# Patient Record
Sex: Male | Born: 1990 | Race: White | Hispanic: Yes | Marital: Single | Smoking: Current every day smoker
Health system: Southern US, Community
[De-identification: ages and names within clinical notes are randomized; demographics above are authoritative.]

---

## 2010-11-29 ENCOUNTER — Emergency Department (HOSPITAL_COMMUNITY)
Admission: EM | Admit: 2010-11-29 | Discharge: 2010-11-29 | Disposition: A | Discharge status: Home / Self Care | Payer: Self-pay | Attending: Emergency Medicine | Admitting: Emergency Medicine

## 2010-11-29 DIAGNOSIS — S5000XA Contusion of unspecified elbow, initial encounter: Secondary | ICD-10-CM | POA: Insufficient documentation

## 2010-11-29 DIAGNOSIS — R51 Headache: Secondary | ICD-10-CM | POA: Insufficient documentation

## 2010-11-29 DIAGNOSIS — IMO0002 Reserved for concepts with insufficient information to code with codable children: Secondary | ICD-10-CM | POA: Insufficient documentation

## 2010-12-03 ENCOUNTER — Emergency Department (HOSPITAL_COMMUNITY)
Admission: EM | Admit: 2010-12-03 | Discharge: 2010-12-03 | Disposition: A | Discharge status: Home / Self Care | Payer: Self-pay | Attending: Emergency Medicine | Admitting: Emergency Medicine

## 2010-12-03 ENCOUNTER — Emergency Department (HOSPITAL_COMMUNITY): Payer: Self-pay

## 2010-12-03 DIAGNOSIS — S61209A Unspecified open wound of unspecified finger without damage to nail, initial encounter: Secondary | ICD-10-CM | POA: Insufficient documentation

## 2010-12-03 DIAGNOSIS — S51809A Unspecified open wound of unspecified forearm, initial encounter: Secondary | ICD-10-CM | POA: Insufficient documentation

## 2018-11-15 ENCOUNTER — Encounter (HOSPITAL_COMMUNITY): Payer: Self-pay | Admitting: Emergency Medicine

## 2018-11-15 ENCOUNTER — Other Ambulatory Visit: Payer: Self-pay

## 2018-11-15 ENCOUNTER — Emergency Department (HOSPITAL_COMMUNITY)
Admission: EM | Admit: 2018-11-15 | Discharge: 2018-11-16 | Disposition: A | Discharge status: Home / Self Care | Payer: Self-pay | Attending: Emergency Medicine | Admitting: Emergency Medicine

## 2018-11-15 DIAGNOSIS — L0231 Cutaneous abscess of buttock: Secondary | ICD-10-CM | POA: Insufficient documentation

## 2018-11-15 DIAGNOSIS — F1290 Cannabis use, unspecified, uncomplicated: Secondary | ICD-10-CM | POA: Insufficient documentation

## 2018-11-15 DIAGNOSIS — F17210 Nicotine dependence, cigarettes, uncomplicated: Secondary | ICD-10-CM | POA: Insufficient documentation

## 2018-11-15 NOTE — ED Triage Notes (Signed)
abcess to left buttock.  Patient reports it came up on Monday.  Worse since then.

## 2018-11-16 MED ORDER — LIDOCAINE-EPINEPHRINE (PF) 2 %-1:200000 IJ SOLN
10.0000 mL | Freq: Once | INTRAMUSCULAR | Status: AC
  Administered 2018-11-16: 10 mL
  Filled 2018-11-16: qty 20

## 2018-11-16 MED ORDER — SULFAMETHOXAZOLE-TRIMETHOPRIM 800-160 MG PO TABS: 1.0000 | ORAL_TABLET | Freq: Two times a day (BID) | ORAL | 0 refills | Status: AC

## 2018-11-16 NOTE — ED Provider Notes (Signed)
[facility] Provider Note   CSN: [id number] Arrival date & time: 11/15/18  2334    History   Chief Complaint Chief Complaint  Patient presents with  . Abscess    HPI Micheal Reyes is a 28 y.o. male.     28 year old male presents to the emergency department for evaluation of an abscess x5 days.  Abscess has been constant, worsening. Tried applying warm compresses without relief. Has been smoking marijuana to cope with his pain. No pain with defecation, fevers, drainage from the area, hx of prior abscess.     History reviewed. No pertinent past medical history.  There are no active problems to display for this patient.   History reviewed. No pertinent surgical history.      Home Medications    Prior to Admission medications   Medication Sig Start Date End Date Taking? Authorizing Provider  sulfamethoxazole-trimethoprim (BACTRIM DS) 800-160 MG tablet Take 1 tablet by mouth 2 (two) times daily for 5 days. 11/16/18 11/21/18  Antony MaduraHumes, Azani Brogdon, PA-C    Family History No family history on file.  Social History Social History   Tobacco Use  . Smoking status: Current Every Day Smoker  . Smokeless tobacco: Never Used  Substance Use Topics  . Alcohol use: Yes    Comment: occasionally  . Drug use: Yes    Types: Marijuana     Allergies   Patient has no allergy information on record.   Review of Systems Review of Systems Ten systems reviewed and are negative for acute change, except as noted in the HPI.    Physical Exam Updated Vital Signs BP 126/85   Pulse 82   Temp 98 F (36.7 C) (Oral)   Resp 16   Ht 5\' 5"  (1.651 m)   Wt 85.7 kg   SpO2 98%   BMI 31.45 kg/m   Physical Exam Vitals signs and nursing note reviewed.  Constitutional:      General: He is not in acute distress.    Appearance: He is well-developed. He is not diaphoretic.     Comments: Nontoxic appearing and in NAD  HENT:     Head: Normocephalic and  atraumatic.  Eyes:     General: No scleral icterus.    Conjunctiva/sclera: Conjunctivae normal.  Neck:     Musculoskeletal: Normal range of motion.  Cardiovascular:     Rate and Rhythm: Normal rate and regular rhythm.     Pulses: Normal pulses.  Pulmonary:     Effort: Pulmonary effort is normal. No respiratory distress.     Comments: Respirations even and unlabored Genitourinary:   Musculoskeletal: Normal range of motion.  Skin:    General: Skin is warm and dry.     Coloration: Skin is not pale.     Findings: No erythema or rash.  Neurological:     General: No focal deficit present.     Mental Status: He is alert and oriented to person, place, and time.     Coordination: Coordination normal.  Psychiatric:        Behavior: Behavior normal.      ED Treatments / Results  Labs (all labs ordered are listed, but only abnormal results are displayed) Labs Reviewed - No data to display  EKG None  Radiology No results found.  Procedures Procedures (including critical care time)  INCISION AND DRAINAGE Performed by: Antony MaduraKelly Frayda Egley Consent: Verbal consent obtained. Risks and benefits: risks, benefits and alternatives were discussed Type: abscess  Body area:  L gluteal  Anesthesia: local infiltration  Incision was made with a scalpel.  Local anesthetic: lidocaine 1% with epinephrine  Anesthetic total: 6 ml  Complexity: complex Blunt dissection to break up loculations  Drainage: purulent and bloody  Drainage amount: moderate  Packing material: none  Patient tolerance: Patient tolerated the procedure well with no immediate complications.     Medications Ordered in ED Medications  lidocaine-EPINEPHrine (XYLOCAINE W/EPI) 2 %-1:200000 (PF) injection 10 mL (10 mLs Other Given 11/16/18 0033)     Initial Impression / Assessment and Plan / ED Course  I have reviewed the triage vital signs and the nursing notes.  Pertinent labs & imaging results that were  available during my care of the patient were reviewed by me and considered in my medical decision making (see chart for details).        Patient with skin abscess amenable to incision and drainage.  Abscess was not large enough to warrant packing or drain, wound recheck in 2 days recommended. Encouraged home warm soaks and flushing.  Mild signs of cellulitis is surrounding skin.  Will d/c to home with Bactrim x 5 days.  Return precautions discussed and provided. Patient discharged in stable condition with no unaddressed concerns.   Final Clinical Impressions(s) / ED Diagnoses   Final diagnoses:  Abscess, gluteal, left    ED Discharge Orders         Ordered    sulfamethoxazole-trimethoprim (BACTRIM DS) 800-160 MG tablet  2 times daily     11/16/18 0100           Antony Madura, PA-C 11/16/18 0106    Ward, Layla Maw, DO 11/16/18 1438

## 2018-11-16 NOTE — Discharge Instructions (Signed)
Take Bactrim as prescribed until finished.  We advised the use of 600 mg ibuprofen every 6 hours for pain.  Continue warm compresses to the area to promote drainage.  Have your wound rechecked in 48 hours, especially if unchanged or worsening.  You may return to the ED for new or concerning symptoms.

## 2018-11-16 NOTE — ED Notes (Signed)
Discharge Instructions discussed with pt. Pt verbalized understanding.

## 2019-08-21 ENCOUNTER — Emergency Department (HOSPITAL_COMMUNITY): Payer: Self-pay

## 2019-08-21 ENCOUNTER — Other Ambulatory Visit: Payer: Self-pay

## 2019-08-21 ENCOUNTER — Encounter (HOSPITAL_COMMUNITY): Payer: Self-pay | Admitting: Emergency Medicine

## 2019-08-21 ENCOUNTER — Emergency Department (HOSPITAL_COMMUNITY)
Admission: EM | Admit: 2019-08-21 | Discharge: 2019-08-22 | Disposition: A | Discharge status: Home / Self Care | Payer: Self-pay | Attending: Emergency Medicine | Admitting: Emergency Medicine

## 2019-08-21 DIAGNOSIS — W19XXXA Unspecified fall, initial encounter: Secondary | ICD-10-CM

## 2019-08-21 DIAGNOSIS — W109XXA Fall (on) (from) unspecified stairs and steps, initial encounter: Secondary | ICD-10-CM | POA: Insufficient documentation

## 2019-08-21 DIAGNOSIS — Y990 Civilian activity done for income or pay: Secondary | ICD-10-CM | POA: Insufficient documentation

## 2019-08-21 DIAGNOSIS — M25571 Pain in right ankle and joints of right foot: Secondary | ICD-10-CM

## 2019-08-21 DIAGNOSIS — Y9289 Other specified places as the place of occurrence of the external cause: Secondary | ICD-10-CM | POA: Insufficient documentation

## 2019-08-21 DIAGNOSIS — S80811A Abrasion, right lower leg, initial encounter: Secondary | ICD-10-CM

## 2019-08-21 DIAGNOSIS — Y9389 Activity, other specified: Secondary | ICD-10-CM | POA: Insufficient documentation

## 2019-08-21 NOTE — ED Triage Notes (Addendum)
Pt was at work tonight when he tripped over some steps and landed on his right shin. Reports right ankle pain as well. Skin abrasion noted. Pt reports pain with ambulation. Last tetanus shot 1 year ago. Ice pack applied in triage.

## 2019-08-22 MED ORDER — NAPROXEN 250 MG PO TABS
500.0000 mg | ORAL_TABLET | Freq: Once | ORAL | Status: AC
Start: 2019-08-22 — End: 2019-08-22
  Administered 2019-08-22: 500 mg via ORAL
  Filled 2019-08-22: qty 2

## 2019-08-22 MED ORDER — IBUPROFEN 600 MG PO TABS: 600.0000 mg | ORAL_TABLET | Freq: Four times a day (QID) | ORAL | 0 refills | Status: DC | PRN

## 2019-08-22 NOTE — ED Notes (Signed)
Patient verbalizes understanding of discharge instructions. Opportunity for questioning and answers were provided. Armband removed by staff, pt discharged from ED ambulatory.   

## 2019-08-22 NOTE — Discharge Instructions (Signed)
Ice areas of injury 3-4 times per day to limit inflammation and spasm.  We recommend consistent use of ibuprofen as prescribed.  Use an ankle brace for stability. Follow-up with a primary care doctor to ensure resolution of symptoms.  Return to the ED for any new or concerning symptoms.

## 2019-08-22 NOTE — Progress Notes (Signed)
Orthopedic Tech Progress Note Patient Details:  Micheal Reyes June 08, 1991 [id number]  Ortho Devices Type of Ortho Device: ASO Ortho Device/Splint Location: RLE Ortho Device/Splint Interventions: Ordered, Application, Adjustment   Post Interventions Patient Tolerated: Well Instructions Provided: Care of device, Poper ambulation with device, Adjustment of device   Micheal Reyes Micheal Reyes 08/22/2019, 12:48 AM

## 2019-08-22 NOTE — ED Provider Notes (Signed)
[facility] EMERGENCY DEPARTMENT Provider Note   CSN: [id number] Arrival date & time: 08/21/19  2324     History Chief Complaint  Patient presents with  . Leg Pain  . Leg Injury    Revis Whalin is a 29 y.o. male.  30 year old male presents to the emergency department for pain to his right lower extremity.  He states that he was walking at work carrying 2 boxes down some steps when he tripped and fell twisting his right ankle and abrading his right shin.  Triage states that incident occurred tonight, but patient reports that this actually occurred while at work around 1300.  He continued to work through the discomfort and was ambulatory until the end of his shift.  Went home and smoked some marijuana which helped his pain some.  Reports acute worsening of his pain just prior to arrival prompting his ED evaluation.  He has not had any numbness, paresthesias, weakness.  No prior injury to the right lower extremity or ankle.  Last tetanus was 1 year ago.  The history is provided by the patient. No language interpreter was used.  Leg Pain      History reviewed. No pertinent past medical history.  There are no problems to display for this patient.   History reviewed. No pertinent surgical history.     No family history on file.  Social History   Tobacco Use  . Smoking status: Current Every Day Smoker  . Smokeless tobacco: Never Used  Substance Use Topics  . Alcohol use: Yes    Comment: occasionally  . Drug use: Yes    Types: Marijuana    Home Medications Prior to Admission medications   Medication Sig Start Date End Date Taking? Authorizing Provider  ibuprofen (ADVIL) 600 MG tablet Take 1 tablet (600 mg total) by mouth every 6 (six) hours as needed. 08/22/19   Antony Madura, PA-C    Allergies    Patient has no known allergies.  Review of Systems   Review of Systems  Ten systems reviewed and are negative for acute change, except as noted in the  HPI.    Physical Exam Updated Vital Signs BP 123/76   Pulse 95   Temp 98.6 F (37 C) (Oral)   Resp 16   SpO2 99%   Physical Exam Vitals and nursing note reviewed.  Constitutional:      General: He is not in acute distress.    Appearance: He is well-developed. He is not diaphoretic.     Comments: Nontoxic and in NAD  HENT:     Head: Normocephalic and atraumatic.  Eyes:     General: No scleral icterus.    Conjunctiva/sclera: Conjunctivae normal.  Cardiovascular:     Rate and Rhythm: Normal rate and regular rhythm.     Pulses: Normal pulses.     Comments: DP pulse 2+ in the RLE Pulmonary:     Effort: Pulmonary effort is normal. No respiratory distress.     Comments: Respirations even and unlabored Musculoskeletal:     Cervical back: Normal range of motion.     Right ankle: Swelling (minimal) present. No deformity or lacerations. Tenderness present over the lateral malleolus. Normal range of motion.     Right Achilles Tendon: Normal. Thompson's test negative.       Legs:  Skin:    General: Skin is warm and dry.     Coloration: Skin is not pale.     Findings: No erythema or rash.  Neurological:     General: No focal deficit present.     Mental Status: He is alert and oriented to person, place, and time.     Coordination: Coordination normal.     Comments: Sensation to light touch intact in the RLE. Patient able to wiggle all toes.  Psychiatric:        Behavior: Behavior normal.     ED Results / Procedures / Treatments   Labs (all labs ordered are listed, but only abnormal results are displayed) Labs Reviewed - No data to display  EKG None  Radiology DG Tibia/Fibula Right  Result Date: 08/22/2019 CLINICAL DATA:  Fall down stairs with right leg pain, initial encounter EXAM: RIGHT TIBIA AND FIBULA - 2 VIEW COMPARISON:  None. FINDINGS: There is no evidence of fracture or other focal bone lesions. Soft tissues are unremarkable. IMPRESSION: No acute abnormality noted.  Electronically Signed   By: Inez Catalina M.D.   On: 08/22/2019 00:01   DG Ankle Complete Right  Result Date: 08/22/2019 CLINICAL DATA:  Fall downstairs with right ankle pain, initial encounter EXAM: RIGHT ANKLE - COMPLETE 3+ VIEW COMPARISON:  None. FINDINGS: There is no evidence of fracture, dislocation, or joint effusion. There is no evidence of arthropathy or other focal bone abnormality. Soft tissues are unremarkable. IMPRESSION: No acute abnormality noted. Electronically Signed   By: Inez Catalina M.D.   On: 08/22/2019 00:01    Procedures Procedures (including critical care time)  Medications Ordered in ED Medications  naproxen (NAPROSYN) tablet 500 mg (has no administration in time range)    ED Course  I have reviewed the triage vital signs and the nursing notes.  Pertinent labs & imaging results that were available during my care of the patient were reviewed by me and considered in my medical decision making (see chart for details).    MDM Rules/Calculators/A&P                      Patient presents to the emergency department for evaluation of RLE pain. Patient neurovascularly intact on exam. Imaging negative for fracture, dislocation, bony deformity. No erythema, heat to touch to the affected area; no concern for septic joint. Compartments in the affected extremity are soft. Plan for supportive management including RICE and NSAIDs; primary care follow up as needed. ASO applied to R ankle prior to discharge. Return precautions discussed and provided. Patient discharged in stable condition with no unaddressed concerns.   Final Clinical Impression(s) / ED Diagnoses Final diagnoses:  Fall, initial encounter  Acute right ankle pain  Abrasion, right lower leg, initial encounter    Rx / DC Orders ED Discharge Orders         Ordered    ibuprofen (ADVIL) 600 MG tablet  Every 6 hours PRN     08/22/19 0026           Antonietta Breach, PA-C 08/22/19 0032    Ward, Delice Bison, DO  08/22/19 0105

## 2020-08-19 ENCOUNTER — Emergency Department (HOSPITAL_COMMUNITY)
Admission: EM | Admit: 2020-08-19 | Discharge: 2020-08-20 | Disposition: A | Discharge status: Home / Self Care | Payer: Self-pay | Attending: Emergency Medicine | Admitting: Emergency Medicine

## 2020-08-19 ENCOUNTER — Other Ambulatory Visit: Payer: Self-pay

## 2020-08-19 ENCOUNTER — Encounter (HOSPITAL_COMMUNITY): Payer: Self-pay | Admitting: Emergency Medicine

## 2020-08-19 ENCOUNTER — Emergency Department (HOSPITAL_COMMUNITY): Payer: Self-pay

## 2020-08-19 DIAGNOSIS — Z5321 Procedure and treatment not carried out due to patient leaving prior to being seen by health care provider: Secondary | ICD-10-CM | POA: Insufficient documentation

## 2020-08-19 DIAGNOSIS — R519 Headache, unspecified: Secondary | ICD-10-CM | POA: Insufficient documentation

## 2020-08-19 DIAGNOSIS — M542 Cervicalgia: Secondary | ICD-10-CM | POA: Insufficient documentation

## 2020-08-19 DIAGNOSIS — M79671 Pain in right foot: Secondary | ICD-10-CM | POA: Insufficient documentation
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
## 2020-08-20 ENCOUNTER — Other Ambulatory Visit: Payer: Self-pay

## 2020-08-20 ENCOUNTER — Emergency Department (HOSPITAL_COMMUNITY)
Admission: EM | Admit: 2020-08-20 | Discharge: 2020-08-20 | Disposition: A | Discharge status: Home / Self Care | Payer: Self-pay | Attending: Emergency Medicine | Admitting: Emergency Medicine

## 2020-08-20 DIAGNOSIS — M79622 Pain in left upper arm: Secondary | ICD-10-CM

## 2020-08-20 DIAGNOSIS — M549 Dorsalgia, unspecified: Secondary | ICD-10-CM | POA: Insufficient documentation

## 2020-08-20 DIAGNOSIS — M542 Cervicalgia: Secondary | ICD-10-CM | POA: Insufficient documentation

## 2020-08-20 DIAGNOSIS — M79602 Pain in left arm: Secondary | ICD-10-CM | POA: Insufficient documentation

## 2020-08-20 DIAGNOSIS — Y92410 Unspecified street and highway as the place of occurrence of the external cause: Secondary | ICD-10-CM | POA: Insufficient documentation

## 2020-08-20 DIAGNOSIS — F17200 Nicotine dependence, unspecified, uncomplicated: Secondary | ICD-10-CM | POA: Insufficient documentation

## 2020-08-20 DIAGNOSIS — M79605 Pain in left leg: Secondary | ICD-10-CM | POA: Insufficient documentation

## 2020-08-20 MED ORDER — LIDOCAINE 5 % EX PTCH: 1.0000 | MEDICATED_PATCH | CUTANEOUS | 0 refills | Status: DC
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
## 2020-08-20 NOTE — ED Notes (Signed)
Pt discharged home. Discharge instructions provided and pt verbalized understanding. Prescriptions given x1 and pt educated on how to apply patch. AOX4 Respirations even and unlabored at room air with no distress noted. Ambulatory to ED lobby with strong and steady gait.

## 2020-08-20 NOTE — ED Notes (Signed)
Pt did not respond for vitals check and has not been seen in our outside the lobby for over an hour
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
## 2021-04-21 ENCOUNTER — Emergency Department (HOSPITAL_COMMUNITY): Payer: Self-pay

## 2021-04-21 ENCOUNTER — Emergency Department (HOSPITAL_COMMUNITY)
Admission: EM | Admit: 2021-04-21 | Discharge: 2021-04-21 | Disposition: A | Discharge status: Home / Self Care | Payer: Self-pay | Attending: Emergency Medicine | Admitting: Emergency Medicine

## 2021-04-21 DIAGNOSIS — S61332A Puncture wound without foreign body of right middle finger with damage to nail, initial encounter: Secondary | ICD-10-CM | POA: Insufficient documentation

## 2021-04-21 DIAGNOSIS — Z23 Encounter for immunization: Secondary | ICD-10-CM | POA: Insufficient documentation

## 2021-04-21 DIAGNOSIS — F17200 Nicotine dependence, unspecified, uncomplicated: Secondary | ICD-10-CM | POA: Insufficient documentation

## 2021-04-21 DIAGNOSIS — Y990 Civilian activity done for income or pay: Secondary | ICD-10-CM | POA: Insufficient documentation

## 2021-04-21 DIAGNOSIS — W450XXA Nail entering through skin, initial encounter: Secondary | ICD-10-CM | POA: Insufficient documentation

## 2021-04-21 MED ORDER — OXYCODONE-ACETAMINOPHEN 5-325 MG PO TABS
1.0000 | ORAL_TABLET | Freq: Once | ORAL | Status: AC
  Administered 2021-04-21: 1 via ORAL
  Filled 2021-04-21: qty 1

## 2021-04-21 MED ORDER — ACETAMINOPHEN 325 MG PO TABS: 650.0000 mg | ORAL_TABLET | Freq: Four times a day (QID) | ORAL | 0 refills | Status: DC | PRN

## 2021-04-21 MED ORDER — DOXYCYCLINE HYCLATE 100 MG PO TABS
100.0000 mg | ORAL_TABLET | Freq: Once | ORAL | Status: AC
Start: 2021-04-21 — End: 2021-04-21
  Administered 2021-04-21: 100 mg via ORAL
  Filled 2021-04-21: qty 1

## 2021-04-21 MED ORDER — DOXYCYCLINE HYCLATE 100 MG PO CAPS: 100.0000 mg | ORAL_CAPSULE | Freq: Two times a day (BID) | ORAL | 0 refills | Status: AC

## 2021-04-21 MED ORDER — TETANUS-DIPHTH-ACELL PERTUSSIS 5-2.5-18.5 LF-MCG/0.5 IM SUSY
0.5000 mL | PREFILLED_SYRINGE | Freq: Once | INTRAMUSCULAR | Status: AC
  Administered 2021-04-21: 0.5 mL via INTRAMUSCULAR
  Filled 2021-04-21: qty 0.5

## 2021-04-21 MED ORDER — IBUPROFEN 600 MG PO TABS: 600.0000 mg | ORAL_TABLET | Freq: Four times a day (QID) | ORAL | 0 refills | Status: DC | PRN

## 2021-04-21 MED ORDER — ACETAMINOPHEN 500 MG PO TABS
1000.0000 mg | ORAL_TABLET | Freq: Once | ORAL | Status: AC
  Administered 2021-04-21: 1000 mg via ORAL
  Filled 2021-04-21: qty 2

## 2021-04-21 NOTE — Discharge Instructions (Addendum)
Patient contacted the ER if you notice redness streaking up your finger, finger swelling and difficulty bending it, fevers, chills, or sudden worsening pain under your nail.

## 2021-04-21 NOTE — ED Notes (Signed)
Pt A&OX4 ambulatory at d/c with independent steady gait. Pt verbalized understanding of d/c instructions, prescriptions and follow up care. 

## 2021-04-21 NOTE — ED Provider Notes (Addendum)
[facility] EMERGENCY DEPARTMENT Provider Note   CSN: [id number] Arrival date & time: 04/21/21  1846     History Chief Complaint  Patient presents with   Finger Injury    Micheal Reyes is a 30 y.o. male presenting with right 3rd finger injury at work. Nailbed accidental pierced by a nail, which came out entirely.  Tetanus was not up to date.  He is right handed.  No other injuries  HPI     No past medical history on file.  There are no problems to display for this patient.   No past surgical history on file.     No family history on file.  Social History   Tobacco Use   Smoking status: Every Day   Smokeless tobacco: Never  Substance Use Topics   Alcohol use: Yes    Comment: occasionally   Drug use: Yes    Types: Marijuana    Home Medications Prior to Admission medications   Medication Sig Start Date End Date Taking? Authorizing Provider  acetaminophen (TYLENOL) 325 MG tablet Take 2 tablets (650 mg total) by mouth every 6 (six) hours as needed for up to 30 doses for moderate pain or mild pain. 04/21/21  Yes Kham Zuckerman, Kermit Balo, MD  doxycycline (VIBRAMYCIN) 100 MG capsule Take 1 capsule (100 mg total) by mouth 2 (two) times daily for 5 days. 04/21/21 04/26/21 Yes Raedyn Klinck, Kermit Balo, MD  ibuprofen (ADVIL) 600 MG tablet Take 1 tablet (600 mg total) by mouth every 6 (six) hours as needed for up to 30 doses for moderate pain or mild pain. 04/21/21  Yes Terald Sleeper, MD  ibuprofen (ADVIL) 600 MG tablet Take 1 tablet (600 mg total) by mouth every 6 (six) hours as needed. 08/22/19   Antony Madura, PA-C  lidocaine (LIDODERM) 5 % Place 1 patch onto the skin daily. Remove & Discard patch within 12 hours or as directed by MD 08/20/20   Haskel Schroeder, PA-C    Allergies    Patient has no known allergies.  Review of Systems   Review of Systems  Constitutional:  Negative for chills and fever.  Respiratory:  Negative for cough and shortness of breath.    Cardiovascular:  Negative for chest pain and palpitations.  Gastrointestinal:  Negative for abdominal pain and vomiting.  Musculoskeletal:  Positive for arthralgias and myalgias.  Skin:  Negative for color change and rash.  Neurological:  Negative for weakness and numbness.  All other systems reviewed and are negative.  Physical Exam Updated Vital Signs BP 120/73 (BP Location: Left Arm)   Pulse 77   Temp (!) 97.5 F (36.4 C) (Oral)   Resp 18   SpO2 96%   Physical Exam Constitutional:      General: He is not in acute distress. HENT:     Head: Normocephalic and atraumatic.  Eyes:     Conjunctiva/sclera: Conjunctivae normal.     Pupils: Pupils are equal, round, and reactive to light.  Cardiovascular:     Rate and Rhythm: Normal rate and regular rhythm.  Pulmonary:     Effort: Pulmonary effort is normal. No respiratory distress.  Abdominal:     General: There is no distension.     Tenderness: There is no abdominal tenderness.  Musculoskeletal:     Comments: Affected finger not held in flexion. No swelling or tenderness over tendon. No fusiform swelling of finger No pain with passive extension. Cracked 3rd finger nail bed, no subungal hematoma, no  visible foreign body  Skin:    General: Skin is warm and dry.  Neurological:     General: No focal deficit present.     Mental Status: He is alert. Mental status is at baseline.  Psychiatric:        Mood and Affect: Mood normal.        Behavior: Behavior normal.    ED Results / Procedures / Treatments   Labs (all labs ordered are listed, but only abnormal results are displayed) Labs Reviewed - No data to display  EKG None  Radiology DG Finger Middle Right  Result Date: 04/21/2021 CLINICAL DATA:  Finger injury. EXAM: RIGHT MIDDLE FINGER 2+V COMPARISON:  Hand radiograph 12/03/2010 FINDINGS: There is no evidence of fracture or dislocation. Normal alignment and joint spaces. Stable lucency in the radial aspect of the  distal phalanx from prior exam, may represent sequela of prior injury, epidermal inclusion cyst, or congenital abnormality. Regardless, this is benign. Soft tissues are unremarkable. IMPRESSION: No acute fracture or subluxation of the right third digit. Electronically Signed   By: Narda Rutherford M.D.   On: 04/21/2021 20:53    Procedures Procedures   Medications Ordered in ED Medications  Tdap (BOOSTRIX) injection 0.5 mL (0.5 mLs Intramuscular Given 04/21/21 2144)  oxyCODONE-acetaminophen (PERCOCET/ROXICET) 5-325 MG per tablet 1 tablet (1 tablet Oral Given 04/21/21 2144)  doxycycline (VIBRA-TABS) tablet 100 mg (100 mg Oral Given 04/21/21 2144)  acetaminophen (TYLENOL) tablet 1,000 mg (1,000 mg Oral Given 04/21/21 2144)    ED Course  I have reviewed the triage vital signs and the nursing notes.  Pertinent labs & imaging results that were available during my care of the patient were reviewed by me and considered in my medical decision making (see chart for details).  Patient appears to have had an in and out pierce injury to his fingernail.  No evidence of retained foreign body on xray.  No clear underlying fx.  No signs of tenosynovitis or infection.  Updated tetanus, start on doxycycline ppx.  Attempted irrigation but difficult as injury is subungual.  Return precautions given for infection.  Clinical Course as of 04/21/21 2259  Thu Apr 21, 2021  2145 Pt advised not to drive after receiving narcotics in ED.  He states ride will pick him up [MT]    Clinical Course User Index [MT] Lunden Mcleish, Kermit Balo, MD     Final Clinical Impression(s) / ED Diagnoses Final diagnoses:  Injury by nail, initial encounter    Rx / DC Orders ED Discharge Orders          Ordered    doxycycline (VIBRAMYCIN) 100 MG capsule  2 times daily        04/21/21 2145    ibuprofen (ADVIL) 600 MG tablet  Every 6 hours PRN        04/21/21 2145    acetaminophen (TYLENOL) 325 MG tablet  Every 6 hours PRN         04/21/21 2145             Terald Sleeper, MD 04/21/21 2156    Terald Sleeper, MD 04/21/21 2259

## 2021-04-21 NOTE — ED Provider Notes (Signed)
Emergency Medicine Provider Triage Evaluation Note  Seldon Barrell , a 30 y.o. male  was evaluated in triage.  Pt complains of right third finger injury occurring at about 6 PM.  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  Unknown last tetanus vaccination. Notes he smoked marijuana for the pain.   Review of Systems  Positive: Right third finger injury Negative: Numbness, tingling  Physical Exam  BP 125/75 (BP Location: Right Arm)   Pulse 86   Temp (!) 97.5 F (36.4 C) (Oral)   Resp (!) 22   SpO2 100%  Gen:   Awake, no distress   Resp:  Normal effort  MSK:   Moves extremities without difficulty  Other:  Puncture wound to the right third nail with no obvious exit wound, bleeding is controlled in triage, no nailbed involvement  Medical Decision Making  Medically screening exam initiated at 8:28 PM.  Appropriate orders placed.  Lyal Husted was informed that the remainder of the evaluation will be completed by another provider, this initial triage assessment does not replace that evaluation, and the importance of remaining in the ED until their evaluation is complete.     Jeanella Flattery 04/21/21 2030    Terald Sleeper, MD 04/21/21 2042

## 2021-04-21 NOTE — ED Triage Notes (Signed)
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 CMS intact surrounding injury, unknown last tetanus.

## 2022-05-30 ENCOUNTER — Ambulatory Visit (HOSPITAL_COMMUNITY)
Admission: EM | Admit: 2022-05-30 | Discharge: 2022-05-31 | Disposition: A | Discharge status: Home / Self Care | Payer: No Payment, Other | Attending: Nurse Practitioner | Admitting: Nurse Practitioner

## 2022-05-30 ENCOUNTER — Encounter (HOSPITAL_COMMUNITY): Payer: Self-pay | Admitting: Emergency Medicine

## 2022-05-30 ENCOUNTER — Other Ambulatory Visit: Payer: Self-pay

## 2022-05-30 DIAGNOSIS — Z1152 Encounter for screening for COVID-19: Secondary | ICD-10-CM | POA: Insufficient documentation

## 2022-05-30 DIAGNOSIS — F4321 Adjustment disorder with depressed mood: Secondary | ICD-10-CM | POA: Insufficient documentation

## 2022-05-30 DIAGNOSIS — F4323 Adjustment disorder with mixed anxiety and depressed mood: Secondary | ICD-10-CM | POA: Diagnosis present

## 2022-05-30 DIAGNOSIS — R45851 Suicidal ideations: Secondary | ICD-10-CM | POA: Diagnosis not present

## 2022-05-30 DIAGNOSIS — Z046 Encounter for general psychiatric examination, requested by authority: Secondary | ICD-10-CM

## 2022-05-30 DIAGNOSIS — Z630 Problems in relationship with spouse or partner: Secondary | ICD-10-CM | POA: Insufficient documentation

## 2022-05-30 LAB — POCT URINE DRUG SCREEN - MANUAL ENTRY (I-SCREEN)
POC Amphetamine UR: NOT DETECTED
POC Buprenorphine (BUP): NOT DETECTED
POC Cocaine UR: NOT DETECTED
POC Marijuana UR: POSITIVE — AB
POC Methadone UR: NOT DETECTED
POC Methamphetamine UR: NOT DETECTED
POC Morphine: NOT DETECTED
POC Oxazepam (BZO): NOT DETECTED
POC Oxycodone UR: NOT DETECTED
POC Secobarbital (BAR): NOT DETECTED

## 2022-05-30 LAB — POC SARS CORONAVIRUS 2 AG: SARSCOV2ONAVIRUS 2 AG: NEGATIVE

## 2022-05-30 MED ORDER — TRAZODONE HCL 50 MG PO TABS
50.0000 mg | ORAL_TABLET | Freq: Every evening | ORAL | Status: DC | PRN
  Administered 2022-05-30: 50 mg via ORAL
  Filled 2022-05-30: qty 1

## 2022-05-30 MED ORDER — MAGNESIUM HYDROXIDE 400 MG/5ML PO SUSP: 30.0000 mL | Freq: Every day | ORAL | Status: DC | PRN

## 2022-05-30 MED ORDER — ACETAMINOPHEN 325 MG PO TABS: 650.0000 mg | ORAL_TABLET | Freq: Four times a day (QID) | ORAL | Status: DC | PRN

## 2022-05-30 MED ORDER — ALUM & MAG HYDROXIDE-SIMETH 200-200-20 MG/5ML PO SUSP: 30.0000 mL | ORAL | Status: DC | PRN

## 2022-05-30 MED ORDER — HYDROXYZINE HCL 25 MG PO TABS
25.0000 mg | ORAL_TABLET | Freq: Three times a day (TID) | ORAL | Status: DC | PRN
  Administered 2022-05-30: 25 mg via ORAL
  Filled 2022-05-30: qty 1
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
## 2022-05-30 NOTE — ED Notes (Signed)
Pt A&O x 4, presents with suicidal ideations, plan to walk into traffic after loss of job, wife loss job also.  Family loss home also.  Denies HI or AVH.  Pt is IVC.  Monitoring for safety.

## 2022-05-30 NOTE — Discharge Instructions (Addendum)
Shelters for [facility] [address] [place], [place]  [address] [phone] Population served: Families with Wellsite geologist of [employer] [address] [place], [place] [address] [phone] Population Served: Families with children  [facility]) - Emergency Family Shelter [address] [place], [place], [place] [address] [phone] or [phone] Population served: Families with children.   [address] [facility] [address] [place], [place]  [address] [phone] Monday-Friday 8am-3pm Saturday-Sunday: 8am-2Pm Safe place to rest, take care of basic needs, do laundry and etc.   [facility]: [address] [place], [place] [address] [phone]  32 beds for men experiencing homelessness.  Call for openings  [facility] [address] [place], [place] [address] [phone]  Food Pantry Monday-Friday 9"30am-3:30pm Community Lunch : 10:30am-12:30am Call for shelter openings and or present at 8am  [facility]:  [address] [place], [place] [address] [phone]  Provides shelter.  Please call and or present at 8am for openings.   Free hot meals:  [employer]  [place], [place]   [employer] offers a free meal and a measure of hope in [facility] each Tuesday and Thursday evening.  [facility] Address: [address], [place], [place] [address] Phone: [phone]    [facility]:   Address: [address], [place], [place] [address] Hours:  Closed ? Opens 10?AM Tue Phone: [phone]   Medication Management and Therapy for No Insurance/IPRS Based on observation and your report, outpatient services with psychiatry and therapy have been recommended.  It is imperative to your mental health that seek out services 7-10 day from your day of discharge to prevent another crisis. A list of referrals has been  provided below based on your needs and recommendations.  You are not limited to the list provided.  In case of an urgent crisis, you may contact the Mobile Crisis Unit with [facility] at [phone].   You have the option to call 211 for assistance in finding additional mental health agencies if these do not meet your needs.    [facility] [address] [place], [place], [address] [phone] phone  New Patient Assessment/Therapy Walk-ins Monday and Wednesday: 8am until slots are full. Every 1st and 2nd Friday: 1pm - 5pm  NO ASSESSMENT/THERAPY WALK-INS ON TUESDAYS OR THURSDAYS  New Patient Psychiatry/Medication Management Walk-ins Monday-Friday: 8am-11am  For all walk-ins, we ask that you arrive by 7:30am because patient will be seen in the order of arrival.  Availability is limited; therefore, you may not be seen on the same day that you walk-in.  Our goal is to serve and meet the needs of our community to the best of our ability.   [facility] [address] [place], [place], [address] [phone] phone (BCBS, Medicaid; for individuals without insurance, please call and speak to our staff)   [facility] [place]      [address]      [place], [place] [address]      [phone]        [facility]      [address]      [place], , [address]      [phone] phone      [phone] fax        Vesta Mixer  [facility]      [address]      [place], [place], [address]      [phone] phone to set up initial appointment       [facility]     [phone]     [place], [place], [address]     [phone] phone     [phone] fax     Rent assistance in [place] and [place]. Get help paying rent or security deposits in [place] and [facility] [place]. As funding allows, a number  of housing expenses can be paid. The leading [employer], charities, churches and government social service agencies to call for emergency rental assistance, section 8 HUD vouchers or funds to pay for moving costs are listed below.  In addition to these non-profit agencies and government programs, also try calling your local church in [place] for help with paying rent and other housing needs. There is also free legal aid to stop an eviction in [place]. The more charities, not-for-profits and other programs the tenant calls the better chance of receiving rent assistance. Read more on Housing First for the homeless.  The rent programs listed below help thousands of local [place], [employer], and other residents every year. Even if funds or government grants are not available, the agencies may offer referrals or a low interest loan to help tenants pay any back rent that is due to the landlord. In addition most locations can also provide other forms of emergency financial assistance, information and counseling.  [employer] can be reached at [phone] or try [phone]. The charity agency covers the greater [facility] area. While resources are limited, they may have some low income housing assistance for people facing eviction, on the verge of homelessness, or who are facing a crisis. Emergency funds for rental or utility arrears, homeless shelters, and even free vouchers for short term motel or hotel rooms. The main address is [address], [place], [place] [address].  [facility] is a Fish farm manager at [employer]. [address], [place], [place] [address]. Dial [phone].   Holiday representative has another Forensic psychologist center at [address], [place], [place] [address]. This is the local [employer]. Emergency assistance is available to help the low income, working poor, and those on the verge of  poverty, with paying their rent or a security deposit. From time to time other forms of financial assistance may be disbursed, including funds for energy bills and free food. Dial [phone]  [facility] Department of [facility], which is based at [address] in [place], [place], can be reached at or [phone]. They offer a number of different cash as well as financial assistance programs. The government grants can provide temporary rent and housing assistance to [facility] residents in times of a hardship or personal crisis.  The DSS will only help people when other resources are unavailable, so it is a last resort. [employer] (CFA) can assist the low income with paying for one month's rent or even energy bills or utilities. Another DSS office is at [address], [place], [place] [address].  [employer], [phone], covers all of [place]. They offer Housing Development, and long term Rental Assistance from income based housing, including information on section 8. There is also family self-sufficiency, home buying resources, and emergency section 8 vouchers  for expedited access. The location is [address], [place], [place] [address].  [employer] focuses on that city. Programs include Section 8 housing choice vouchers, home buying resources, and income based and/or public housing. Case workers try to help low income families, single moms and the elderly. Emergency section 8 is another subsidized rent assistance program. The address is [address], [place], [place] [address], or dial [phone].  Aeronautical engineer - Designer, fashion/clothing - This non-profit serves all of [place]. Work with a Veterinary surgeon to become self-sufficient over the long term, so access job training, career development, and financial resources.  However, until that time comes of stability comes, the  [facility], [employer]. can often times direct people to short term programs to help pay rent. There are grants available and options such as homeless prevention or rapid rehousing. [place], [place] based, and the address is [address], [place], [place] [address]. Dial [phone] or [phone].  [employer] (call [phone]) runs the Emergency Assistance Program (EAP). You need to be referred to the program. Get help with paying past due rent or security deposits for a new home or low income apartment. Other advice and financial support may be offered too, including a program known as [employer]. Housing placement and locater resources are offered too. Address is [address] [place], [place], [place] [address].  Housing Hotline and Coalition of [place] and [place] provides information on low income housing, linkage to agencies that may have money for emergency rent, and other support. Various programs are focused on homeless prevention and rehousing, including Housing First programs, [employer] of [employer], free counseling and other support..  Referrals are provided from the [employer]. Both tenants as well as homeowners can get advice on where to turn to for assistance. Whether a mortgage, Haematologist, Water quality scientist for single moms, loans, or subsidies, information is offered.  The location is [address], [place], [place] [address]. This is one of the main services for anyone in need. Even get help and find an attorney for free advice or representation in court. Phone - [phone], or more on the [facility] [employer].  Landlord [employer] gives free advice to renters. There is landlord-tenant mediation, help understanding state or government regulations around leasing a home or apartment. Learn about landlord repairs, safe housing, legal aid for security deposits, and eviction  defense from [address], [place], [place]. Call [phone].  The [facility] supports veterans, their spouses, and [employer] service members. The assistance ranges from grants for rent to transitional housing, applications to [employer] or [employer], homeless shelters and more. They offer rent, deposit, and financial help as part of the HUD [facility] program. Address is [address] [place], [place] [address]. Call the non-profit at 920-442-9976  [facility] provides help in a crisis. Loans and other forms of rental assistance may be offered to people facing an eviction, with a focus on the vulnerable such as single moms or seniors. Free advice, information on shelter, and help for first months rent may be offered from the [employer]. Call 757-038-8766 to reach the [employer], or the charity is at [address], [place], [place] [address].  Legal Aid - [facility] - Tenants and low income families can get free legal advice on housing and pay  or quit notices. Free legal representation, consultations and advice is from attorneys on housing matters. The pro-bono is at [address], [place], [place] [phone], call [phone]. They also assist with landlord or property owned discrimination.  [facility] [employer] [employer] provides counseling and financial assistance to low-income, indigent, and homeless persons, including limited funds for rent. There is storage cost help or assistance for utility or security deposits from [address], [place], [place] [id number]. Phone [phone].  Patient is instructed prior to discharge to:  Take all medications as prescribed by his/her mental healthcare provider. Report any adverse effects and or reactions from the medicines to his/her outpatient provider promptly. Keep all scheduled appointments, to ensure that you are getting refills on time and to avoid any  interruption in your medication.  If you are unable to keep an appointment call to reschedule.  Be sure to follow-up with resources and follow-up appointments provided.  Patient has been instructed & cautioned: To not engage in alcohol and or illegal drug use while on prescription medicines. In the event of worsening symptoms, patient is instructed to call the crisis hotline, 911 and or go to the nearest ED for appropriate evaluation and treatment of symptoms. To follow-up with his/her primary care provider for your other medical issues, concerns and or health care needs.  Information: -National Suicide Prevention Lifeline 1-800-SUICIDE or 223-865-27851-502-153-1372.  -988 offers 24/7 access to trained crisis counselors who can help people experiencing mental health-related distress. People can call or text 988 or chat 988lifeline.org for themselves or if they are worried about a loved one who may need crisis support.

## 2022-05-30 NOTE — ED Notes (Signed)
Pt sleeping@this time. Breathing even and unlabored. Will continue to monitor for safety 

## 2022-05-30 NOTE — ED Notes (Signed)
Pt had no belongings  

## 2022-05-30 NOTE — ED Provider Notes (Signed)
[facility] Continuous Assessment Admission H&P  Date: 05/31/22 Patient Name: Micheal Reyes MRN: [id number] Chief Complaint: "I'm tired of the family drama, same old sh..t". Chief Complaint  Patient presents with   IVC    Suicidal      Diagnoses:  Final diagnoses:  Suicidal ideation  Adjustment disorder with depressed mood  Involuntary commitment    HPI: Micheal Reyes is a 31 year old male with no documented psychiatric history, who presented to [facility] escorted by [employer] under IVC with complaints of suicidal ideation.  Per IVC report" Respondent stated to police he wants to die and he wants to walk into traffic.  Respondent and wife lost jobs and house and respondent may be depressed.  Mental health issues unknown".  On evaluation, patient is alert, oriented x 4, and minimally cooperative. Speech is clear and coherent. Pt appears casual. Eye contact is minimal. Mood is depressed affect is flat and congruent with mood. Thought process is logical and thought content is coherent. Pt currently denies SI/HI/AVH. There is no indication that the patient is responding to internal stimuli. No delusions elicited during this assessment.    Patient reports "I am dealing with family drama, I am just tired of talking about the same thing, I was feeling suicidal, but not anymore".  Patient denies HI, denies AVH or paranoia. Patient denies being depressed, reports his stressors are his current unemployment, housing, and relationship troubles with his wife.  Patient is guarded and reluctant to give information as to why he was IVC'd, but reports he just moved in with his mom after he got evicted from his house.  Patient reports he is currently unemployed. Patient reports he has been having "marriage problems, leading to frequent arguments with his wife".  Patient reports "it's all same old shit, she keeps throwing at me over and over because she can't get over it".  Patient reports his sister  called the police because he was laying it all out, was suicidal and stated he was tired of it all".  Patient denies illicit drug use.  Patient denies access to guns or other weapons.  Patient denies a history of suicide attempts, history of inpatient psychiatric hospitalizations or self-harm behaviors.  Reports he is not linked to psychiatric services, and he is not on any psychiatric medications.  Patient reports his sleep and appetite as normal.  Support, encouragement and reassurance provided about ongoing stressors, patient provided with opportunity for questions.   PHQ 2-9:   [place] [facility] from 05/30/2022 in [facility] ED from 04/21/2021 in [place] ED from 08/19/2020 in [place] High Risk No Risk No Risk        Total Time spent with patient: 20 minutes  Musculoskeletal  Strength & Muscle Tone: within normal limits Gait & Station: normal Patient leans: N/A  Psychiatric Specialty Exam  Presentation General Appearance:  Appropriate for Environment  Eye Contact: Minimal  Speech: Clear and Coherent  Speech Volume: Normal  Handedness: Right   Mood and Affect  Mood: Depressed  Affect: Congruent   Thought Process  Thought Processes: Coherent  Descriptions of Associations:Intact  Orientation:Full (Time, Place and Person)  Thought Content:WDL    Hallucinations:Hallucinations: None  Ideas of Reference:None  Suicidal Thoughts:Suicidal Thoughts: No  Homicidal Thoughts:Homicidal Thoughts: No   Sensorium  Memory: Immediate Fair  Judgment: Intact  Insight: Present   Executive Functions  Concentration: Fair  Attention  Span: Fair  Recall: [employer] of Knowledge: Good  Language: Good   Psychomotor Activity  Psychomotor Activity: Psychomotor Activity: Normal   Assets  Assets: Communication  Skills; Desire for Improvement   Sleep  Sleep: Sleep: Fair   Nutritional Assessment (For OBS and FBC admissions only) Has the patient had a weight loss or gain of 10 pounds or more in the last 3 months?: No Has the patient had a decrease in food intake/or appetite?: No Does the patient have dental problems?: No Does the patient have eating habits or behaviors that may be indicators of an eating disorder including binging or inducing vomiting?: No Has the patient recently lost weight without trying?: 0 Has the patient been eating poorly because of a decreased appetite?: 0 Malnutrition Screening Tool Score: 0    Physical Exam Constitutional:      General: He is not in acute distress.    Appearance: He is not diaphoretic.  HENT:     Head: Normocephalic.     Right Ear: External ear normal.     Left Ear: External ear normal.     Nose: No congestion.  Eyes:     General:        Right eye: No discharge.        Left eye: No discharge.  Cardiovascular:     Rate and Rhythm: Normal rate.  Pulmonary:     Effort: Pulmonary effort is normal. No respiratory distress.  Chest:     Chest wall: No tenderness.  Neurological:     Mental Status: He is alert and oriented to person, place, and time.  Psychiatric:        Attention and Perception: Attention and perception normal.        Mood and Affect: Mood is depressed. Mood is not anxious. Affect is flat.        Speech: Speech normal.        Behavior: Behavior is cooperative.        Thought Content: Thought content normal. Thought content is not paranoid or delusional. Thought content does not include homicidal or suicidal ideation. Thought content does not include homicidal or suicidal plan.        Cognition and Memory: Cognition and memory normal.    Review of Systems  Constitutional:  Negative for chills, diaphoresis and fever.  HENT:  Negative for congestion.   Eyes:  Negative for discharge.  Respiratory:  Negative for cough,  shortness of breath and wheezing.   Cardiovascular:  Negative for chest pain and palpitations.  Gastrointestinal:  Negative for diarrhea, nausea and vomiting.  Neurological:  Negative for dizziness, seizures, loss of consciousness and weakness.  Psychiatric/Behavioral:  Positive for depression and suicidal ideas. Negative for hallucinations and substance abuse. The patient is nervous/anxious.     Blood pressure (!) 125/104, pulse 94, resp. rate 18, SpO2 98 %. There is no height or weight on file to calculate BMI.  Past Psychiatric History: See H & P   Is the patient at risk to self? No  Has the patient been a risk to self in the past 6 months? No .    Has the patient been a risk to self within the distant past? No   Is the patient a risk to others? No   Has the patient been a risk to others in the past 6 months? No   Has the patient been a risk to others within the distant past? No   Past Medical History: History  reviewed. No pertinent past medical history. History reviewed. No pertinent surgical history.  Family History: History reviewed. No pertinent family history.  Social History:  Social History   Socioeconomic History   Marital status: Single    Spouse name: Not on file   Number of children: Not on file   Years of education: Not on file   Highest education level: Not on file  Occupational History   Not on file  Tobacco Use   Smoking status: Every Day   Smokeless tobacco: Never  Substance and Sexual Activity   Alcohol use: Yes    Comment: occasionally   Drug use: Yes    Types: Marijuana   Sexual activity: Not on file  Other Topics Concern   Not on file  Social History Narrative   Not on file   Social Determinants of Health   Financial Resource Strain: Not on file  Food Insecurity: Not on file  Transportation Needs: Not on file  Physical Activity: Not on file  Stress: Not on file  Social Connections: Not on file  Intimate Partner Violence: Not on file     SDOH:  SDOH Screenings   Tobacco Use: High Risk (05/30/2022)    Last Labs:  Admission on 05/30/2022  Component Date Value Ref Range Status   POC Amphetamine UR 05/30/2022 None Detected  NONE DETECTED (Cut Off Level 1000 ng/mL) Preliminary   POC Secobarbital (BAR) 05/30/2022 None Detected  NONE DETECTED (Cut Off Level 300 ng/mL) Preliminary   POC Buprenorphine (BUP) 05/30/2022 None Detected  NONE DETECTED (Cut Off Level 10 ng/mL) Preliminary   POC Oxazepam (BZO) 05/30/2022 None Detected  NONE DETECTED (Cut Off Level 300 ng/mL) Preliminary   POC Cocaine UR 05/30/2022 None Detected  NONE DETECTED (Cut Off Level 300 ng/mL) Preliminary   POC Methamphetamine UR 05/30/2022 None Detected  NONE DETECTED (Cut Off Level 1000 ng/mL) Preliminary   POC Morphine 05/30/2022 None Detected  NONE DETECTED (Cut Off Level 300 ng/mL) Preliminary   POC Methadone UR 05/30/2022 None Detected  NONE DETECTED (Cut Off Level 300 ng/mL) Preliminary   POC Oxycodone UR 05/30/2022 None Detected  NONE DETECTED (Cut Off Level 100 ng/mL) Preliminary   POC Marijuana UR 05/30/2022 Positive (A)  NONE DETECTED (Cut Off Level 50 ng/mL) Preliminary   SARSCOV2ONAVIRUS 2 AG 05/30/2022 NEGATIVE  NEGATIVE Final   Comment: (NOTE) SARS-CoV-2 antigen NOT DETECTED.   Negative results are presumptive.  Negative results do not preclude SARS-CoV-2 infection and should not be used as the sole basis for treatment or other patient management decisions, including infection  control decisions, particularly in the presence of clinical signs and  symptoms consistent with COVID-19, or in those who have been in contact with the virus.  Negative results must be combined with clinical observations, patient history, and epidemiological information. The expected result is Negative.  Fact Sheet for Patients: https://www.jennings-kim.com/  Fact Sheet for Healthcare Providers: https://alexander-rogers.biz/  This test  is not yet approved or cleared by the [place] FDA and  has been authorized for detection and/or diagnosis of SARS-CoV-2 by FDA under an Emergency Use Authorization (EUA).  This EUA will remain in effect (meaning this test can be used) for the duration of  the COV                          ID-19 declaration under Section 564(b)(1) of the Act, 21 U.S.C. section 360bbb-3(b)(1), unless the authorization is terminated or revoked sooner.  Allergies: Patient has no known allergies.  PTA Medications: (Not in a hospital admission)  Prior to Admission medications   Medication Sig Start Date End Date Taking? Authorizing Provider  acetaminophen (TYLENOL) 325 MG tablet Take 2 tablets (650 mg total) by mouth every 6 (six) hours as needed for up to 30 doses for moderate pain or mild pain. 04/21/21   Wyvonnia Dusky, MD  ibuprofen (ADVIL) 600 MG tablet Take 1 tablet (600 mg total) by mouth every 6 (six) hours as needed. 08/22/19   Antonietta Breach, PA-C  ibuprofen (ADVIL) 600 MG tablet Take 1 tablet (600 mg total) by mouth every 6 (six) hours as needed for up to 30 doses for moderate pain or mild pain. 04/21/21   Wyvonnia Dusky, MD  lidocaine (LIDODERM) 5 % Place 1 patch onto the skin daily. Remove & Discard patch within 12 hours or as directed by MD 08/20/20   Loni Beckwith, PA-C    Medical Decision Making  Patient is under IVC. Recommend admission to the continuous observation unit for safety monitoring overnight and reeval for SI/HI/AVH in the a.m.  Lab Orders         Resp panel by RT-PCR (RSV, Flu A&B, Covid) Anterior Nasal Swab         CBC with Differential/Platelet         Comprehensive metabolic panel         Hemoglobin A1c         Lipid panel         TSH         POCT Urine Drug Screen - (I-Screen)         POC SARS Coronavirus 2 Ag      As needed medications -Tylenol 650 mg p.o. every 6 hours as needed pain -Maalox 30 mm p.o. every 4 hours as needed indigestion -Atarax 25  mg p.o. 3 times daily as needed anxiety -MOM 30 mL p.o. daily as needed constipation -Trazodone 50 mg p.o. nightly as needed sleep    Recommendations  Based on my evaluation the patient does not appear to have an emergency medical condition.  Patient is under IVC. Recommend admission to the continuous observation unit overnight for safety monitoring.  Patient is unable to contract for safety at this time.  Reeval for SI/HI/AVH in the a.m.  Randon Goldsmith, NP 05/31/22  12:19 AM

## 2022-05-30 NOTE — ED Triage Notes (Signed)
Pt presents to [facility] escorted by [employer] under IVC. Per IVC "Respondent stated to police he wants to die and he wants to walk in traffic. Respondent and wife lost jobs and house and respondent may be depressed. Mental Health issues unknown". Pt states he got into an argument with his wife which resulted in his sister coming to the home and escalating the argument even more. Pt states he broke his wifes phone. Pt endorses SI with a plan to walk on the train tracks or into traffic and reports his sister is a trigger for him. Pt reports occasional THC use but nothing in the past 24 hrs. Pt denies HI and AVH at this time.

## 2022-05-31 DIAGNOSIS — F4323 Adjustment disorder with mixed anxiety and depressed mood: Secondary | ICD-10-CM | POA: Diagnosis present

## 2022-05-31 LAB — LIPID PANEL
Cholesterol: 237 mg/dL — ABNORMAL HIGH (ref 0–200)
HDL: 39 mg/dL — ABNORMAL LOW (ref >40)
LDL Cholesterol: UNDETERMINED mg/dL (ref 0–99)
Total CHOL/HDL Ratio: 6.1 RATIO
Triglycerides: 550 mg/dL — ABNORMAL HIGH (ref <150)
VLDL: UNDETERMINED mg/dL (ref 0–40)

## 2022-05-31 LAB — COMPREHENSIVE METABOLIC PANEL
ALT: 40 U/L (ref 0–44)
AST: 21 U/L (ref 15–41)
Albumin: 4.1 g/dL (ref 3.5–5.0)
Alkaline Phosphatase: 53 U/L (ref 38–126)
Anion gap: 8 (ref 5–15)
BUN: 9 mg/dL (ref 6–20)
CO2: 23 mmol/L (ref 22–32)
Calcium: 9 mg/dL (ref 8.9–10.3)
Chloride: 107 mmol/L (ref 98–111)
Creatinine, Ser: 0.68 mg/dL (ref 0.61–1.24)
GFR, Estimated: >60 mL/min (ref >60)
Glucose, Bld: 81 mg/dL (ref 70–99)
Potassium: 4.6 mmol/L (ref 3.5–5.1)
Sodium: 138 mmol/L (ref 135–145)
Total Bilirubin: 0.2 mg/dL — ABNORMAL LOW (ref 0.3–1.2)
Total Protein: 6.7 g/dL (ref 6.5–8.1)

## 2022-05-31 LAB — RESP PANEL BY RT-PCR (RSV, FLU A&B, COVID)  RVPGX2
Influenza A by PCR: NEGATIVE
Influenza B by PCR: NEGATIVE
Resp Syncytial Virus by PCR: NEGATIVE
SARS Coronavirus 2 by RT PCR: NEGATIVE

## 2022-05-31 LAB — CBC WITH DIFFERENTIAL/PLATELET
Abs Immature Granulocytes: 0.03 10*3/uL (ref 0.00–0.07)
Basophils Absolute: 0 10*3/uL (ref 0.0–0.1)
Basophils Relative: 0 %
Eosinophils Absolute: 0.1 10*3/uL (ref 0.0–0.5)
Eosinophils Relative: 1 %
HCT: 44.9 % (ref 39.0–52.0)
Hemoglobin: 14.7 g/dL (ref 13.0–17.0)
Immature Granulocytes: 0 %
Lymphocytes Relative: 29 %
Lymphs Abs: 2.4 10*3/uL (ref 0.7–4.0)
MCH: 29.3 pg (ref 26.0–34.0)
MCHC: 32.7 g/dL (ref 30.0–36.0)
MCV: 89.4 fL (ref 80.0–100.0)
Monocytes Absolute: 0.5 10*3/uL (ref 0.1–1.0)
Monocytes Relative: 7 %
Neutro Abs: 5.2 10*3/uL (ref 1.7–7.7)
Neutrophils Relative %: 63 %
Platelets: 336 10*3/uL (ref 150–400)
RBC: 5.02 MIL/uL (ref 4.22–5.81)
RDW: 12.9 % (ref 11.5–15.5)
WBC: 8.3 10*3/uL (ref 4.0–10.5)
nRBC: 0 % (ref 0.0–0.2)

## 2022-05-31 LAB — TSH: TSH: 1.615 u[IU]/mL (ref 0.350–4.500)

## 2022-05-31 NOTE — ED Notes (Signed)
Pt was given breakfast  ?

## 2022-05-31 NOTE — Progress Notes (Signed)
LCSW Progress Note  Per Doran Heater, FNP, this pt does not require psychiatric hospitalization at this time.  Pt is psychiatrically cleared.  Discharge instructions include several resources for shelters to accommodate families, providers for outpatient therapy and medication management, and organizations providing assistance with Christmas meals and presents for the children.  EDP xxx and pt's nurse, xxx, have been notified.  Hansel Starling, MSW, LCSW [facility] [phone] or [phone]

## 2022-05-31 NOTE — ED Provider Notes (Signed)
FBC/OBS ASAP Discharge Summary  Date and Time: 05/31/2022 10:47 AM  Name: Micheal Reyes  MRN:  [id number]   Discharge Diagnoses:  Final diagnoses:  Suicidal ideation  Adjustment disorder with depressed mood  Involuntary commitment    Subjective: Patient states "I did not want to kill myself, I got into an argument with my wife yesterday."  Patient is reassessed, face-to-face, by nurse practitioner.  He is reclined in observation area upon my approach, no apparent distress.  He is alert and oriented, pleasant and cooperative during assessment.  Micheal Reyes reports recent stressors include the loss of his job.  He and his wife along with his 3 children moved in with his mother 2 months ago.  He reports on yesterday he and his wife got into a verbal argument.  Patient reports he would prefer to reside with his sister, [name], moving forward because his wife and children are residing with his mother.  Additional stressors include patient reporting he is in the [place].  He states "it is very hard for me to get a job at times."  He has most recently been employed in the [employer] but his mother has secured him employment at her job, at a [employer].  He is looking forward to beginning work at [employer].  He states "I have got a job and I will be just fine."  Micheal Reyes denies suicidal and homicidal ideation.  He indicates he did not want to complete suicide yesterday he just "wanted to get away from my wife for a few minutes."  He denies any history of suicide attempts, denies history of nonsuicidal self-harm behavior.  He easily contracts verbally for safety with this Clinical research associate.  He denies auditory and visual hallucinations.  There is no evidence of delusional thought content and no indication that patient is responding to internal stimuli.  Patient most recently resided with his mother in [place].  Plans to reside with his sister moving forward.  He denies  access to weapons.  He denies alcohol and substance use.  He endorses average sleep and appetite.  Patient offered support and encouragement.  Offered inpatient psychiatric treatment, patient declines at this time.  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  Patient verbalizes understanding of strict return precautions.  Patient gives verbal consent to speak with his mother, [name] phone  number [phone].  Spoke with patient's mother who denies safety concerns, she agrees with plan for patient to reside with his sister.  Patient's mother verbalizes understanding of strict return precautions. Left HIPAA compliant voicemail with sister, [name] [phone].    Patient and family are educated and verbalize understanding of mental health resources and other crisis services in the community. They are instructed to call 911 and present to the nearest emergency room should patient experience any suicidal/homicidal ideation, auditory/visual/hallucinations, or detrimental worsening of mental health condition.     Stay Summary: HPI 05/30/2022- 2024: Micheal Reyes is a 31 year old male with no documented psychiatric history, who presented to [facility] escorted by [employer] under IVC with complaints of suicidal ideation.   Per IVC report" Respondent stated to police he wants to die and he wants to walk into traffic.  Respondent and wife lost jobs and house and respondent may be depressed.  Mental health issues unknown".   On evaluation, patient is alert, oriented x 4, and minimally cooperative. Speech is clear and coherent. Pt appears casual.  Eye contact is minimal. Mood is depressed affect is flat and congruent with mood. Thought process is logical and thought content is coherent. Pt currently denies SI/HI/AVH. There is no indication that the patient is responding to internal stimuli. No delusions elicited during this assessment.      Patient reports "I am dealing with family drama, I am just tired of talking about the same thing, I was feeling suicidal, but not anymore".  Patient denies HI, denies AVH or paranoia. Patient denies being depressed, reports his stressors are his current unemployment, housing, and relationship troubles with his wife.   Patient is guarded and reluctant to give information as to why he was IVC'd, but reports he just moved in with his mom after he got evicted from his house.  Patient reports he is currently unemployed. Patient reports he has been having "marriage problems, leading to frequent arguments with his wife".  Patient reports "it's all same old shit, she keeps throwing at me over and over because she can't get over it".  Patient reports his sister called the police because he was laying it all out, was suicidal and stated he was tired of it all".   Patient denies illicit drug use.  Patient denies access to guns or other weapons.  Patient denies a history of suicide attempts, history of inpatient psychiatric hospitalizations or self-harm behaviors.   Reports he is not linked to psychiatric services, and he is not on any psychiatric medications.  Patient reports his sleep and appetite as normal.   Support, encouragement and reassurance provided about ongoing stressors, patient provided with opportunity for questions.    Total Time spent with patient: 30 minutes  Past Psychiatric History: none reported Past Medical History: History reviewed. No pertinent past medical history. History reviewed. No pertinent surgical history. Family History: History reviewed. No pertinent family history. Family Psychiatric History: none reported Social History:  Social History   Substance and Sexual Activity  Alcohol Use Yes   Comment: occasionally     Social History   Substance and Sexual Activity  Drug Use Yes   Types: Marijuana    Social History   Socioeconomic History   Marital status:  Single    Spouse name: Not on file   Number of children: Not on file   Years of education: Not on file   Highest education level: Not on file  Occupational History   Not on file  Tobacco Use   Smoking status: Every Day   Smokeless tobacco: Never  Substance and Sexual Activity   Alcohol use: Yes    Comment: occasionally   Drug use: Yes    Types: Marijuana   Sexual activity: Not on file  Other Topics Concern   Not on file  Social History Narrative   Not on file   Social Determinants of Health   Financial Resource Strain: Not on file  Food Insecurity: Not on file  Transportation Needs: Not on file  Physical Activity: Not on file  Stress: Not on file  Social Connections: Not on file   SDOH:  SDOH Screenings   Tobacco Use: High Risk (05/30/2022)    Tobacco Cessation:  A prescription for an FDA-approved tobacco cessation medication was offered at discharge and the patient refused  Current Medications:  Current Facility-Administered Medications  Medication Dose Route Frequency Provider Last Rate Last Admin   acetaminophen (TYLENOL) tablet 650 mg  650 mg Oral Q6H PRN Onuoha, Chinwendu V, NP       alum &  mag hydroxide-simeth (MAALOX/MYLANTA) 200-200-20 MG/5ML suspension 30 mL  30 mL Oral Q4H PRN Onuoha, Chinwendu V, NP       hydrOXYzine (ATARAX) tablet 25 mg  25 mg Oral TID PRN Onuoha, Chinwendu V, NP   25 mg at 05/30/22 2123   magnesium hydroxide (MILK OF MAGNESIA) suspension 30 mL  30 mL Oral Daily PRN Onuoha, Chinwendu V, NP       traZODone (DESYREL) tablet 50 mg  50 mg Oral QHS PRN Onuoha, Chinwendu V, NP   50 mg at 05/30/22 2123   No current outpatient medications on file.    PTA Medications: (Not in a hospital admission)       No data to display          Flow[facility] Row [facility]  C-SSRS RISK CATEGORY High Risk No Risk No Risk       Musculoskeletal  Strength & Muscle Tone: within normal limits Gait & Station: normal Patient leans: N/A  Psychiatric Specialty Exam  Presentation  General Appearance:  Appropriate for Environment; Casual  Eye Contact: Good  Speech: Clear and Coherent; Normal Rate  Speech Volume: Normal  Handedness: Right   Mood and Affect  Mood: Euthymic  Affect: Appropriate; Congruent   Thought Process  Thought Processes: Coherent; Goal Directed; Linear  Descriptions of Associations:Intact  Orientation:Full (Time, Place and Person)  Thought Content:Logical; WDL  Diagnosis of Schizophrenia or Schizoaffective disorder in past: No    Hallucinations:Hallucinations: None  Ideas of Reference:None  Suicidal Thoughts:Suicidal Thoughts: No  Homicidal Thoughts:Homicidal Thoughts: No   Sensorium  Memory: Immediate Good; Recent Good  Judgment: Fair  Insight: Fair   Art therapistxecutive Functions  Concentration: Good  Attention Span: Good  Recall: Good  Fund of Knowledge: Good  Language: Good   Psychomotor Activity  Psychomotor Activity: Psychomotor Activity: Normal   Assets  Assets: Communication Skills; Desire for Improvement; Financial Resources/Insurance; Housing   Sleep  Sleep: Sleep: Good   Nutritional Assessment (For OBS and FBC admissions only) Has the patient had a weight loss or gain of 10 pounds or more in the last 3 months?: No Has the patient had a decrease in food intake/or appetite?: No Does the patient have dental problems?: No Does the patient have eating habits or behaviors that may be indicators of an eating disorder including binging or inducing vomiting?: No Has the patient recently lost weight without trying?: 0 Has the patient been eating poorly because of a decreased appetite?: 0 Malnutrition Screening Tool Score: 0    Physical Exam  Physical Exam Vitals  and nursing note reviewed.  Constitutional:      Appearance: Normal appearance. He is well-developed.  HENT:     Head: Normocephalic and atraumatic.     Nose: Nose normal.  Cardiovascular:     Rate and Rhythm: Normal rate.  Pulmonary:     Effort: Pulmonary effort is normal.  Musculoskeletal:        General: Normal range of motion.     Cervical back: Normal range of motion.  Skin:    General: Skin is warm and dry.  Neurological:     Mental Status: He is alert and oriented to person, place, and time.  Psychiatric:        Attention and Perception: Attention and perception normal.        Mood and  Affect: Mood and affect normal.        Speech: Speech normal.        Behavior: Behavior normal. Behavior is cooperative.        Thought Content: Thought content normal.        Cognition and Memory: Cognition and memory normal.    Review of Systems  Constitutional: Negative.   HENT: Negative.    Eyes: Negative.   Respiratory: Negative.    Cardiovascular: Negative.   Gastrointestinal: Negative.   Genitourinary: Negative.   Musculoskeletal: Negative.   Skin: Negative.   Neurological: Negative.   Psychiatric/Behavioral: Negative.     Blood pressure 124/80, pulse 90, temperature (!) 97.5 F (36.4 C), temperature source Oral, resp. rate 18, SpO2 100 %. There is no height or weight on file to calculate BMI.  Demographic Factors:  Male  Loss Factors: Financial problems/change in socioeconomic status  Historical Factors: NA  Risk Reduction Factors:   Responsible for children under 52 years of age, Sense of responsibility to family, Employed, Living with another person, especially a relative, Positive social support, Positive therapeutic relationship, and Positive coping skills or problem solving skills  Continued Clinical Symptoms:    Cognitive Features That Contribute To Risk:  None    Suicide Risk:  Minimal: No identifiable suicidal ideation.  Patients presenting with no  risk factors but with morbid ruminations; may be classified as minimal risk based on the severity of the depressive symptoms  Plan Of Care/Follow-up recommendations:  Patient reviewed with Dr Gretta Cool.  Follow up with outpatient psychiatry, resources provided.  Disposition: Discharge  Lenard Lance, FNP 05/31/2022, 10:47 AM

## 2022-05-31 NOTE — ED Notes (Signed)
Pt sleeping@this time. Breathing even and unlabored. Will continue to monitor for safety 

## 2022-05-31 NOTE — ED Notes (Signed)
Patient A&O x 4, ambulatory. Patient discharged in no acute distress. Patient denied SI/HI, A/VH upon discharge. Patient verbalized understanding of all discharge instructions explained by staff, to include follow up appointments, RX's and safety plan.  Pt had no belongings in locker to be returned. Patient escorted to lobby by staff. Patient left with mom and brother. Safety maintained.

## 2022-06-01 LAB — HEMOGLOBIN A1C
Hgb A1c MFr Bld: 5.6 % (ref 4.8–5.6)
Mean Plasma Glucose: 114 mg/dL

## 2023-07-31 IMAGING — DX DG FINGER MIDDLE 2+V*R*
3 series · 3 of 3 positions shown · non-contrast
Comparison: Hand radiograph 12/03/2010

CLINICAL DATA: Finger injury.

EXAM:
RIGHT MIDDLE FINGER 2+V

[finger ap]
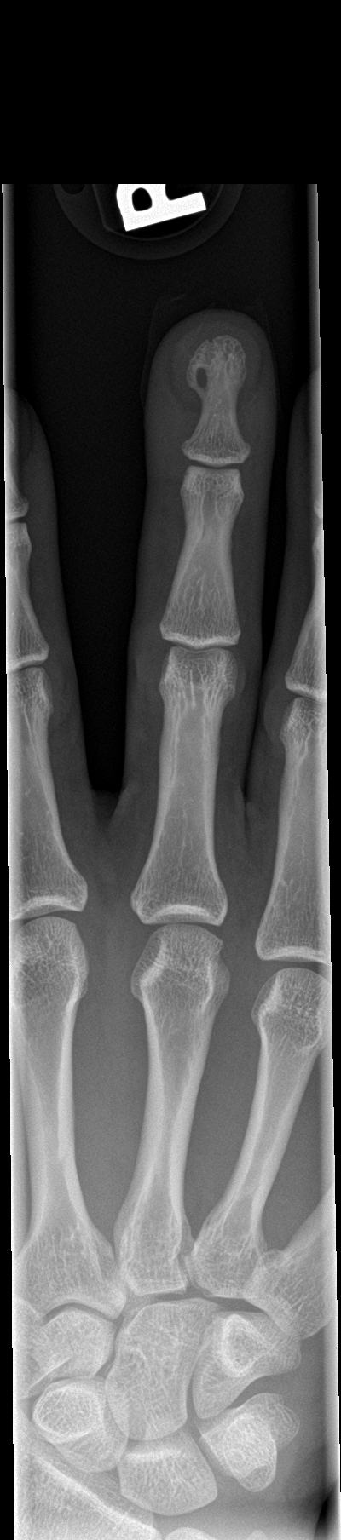

[finger obl]
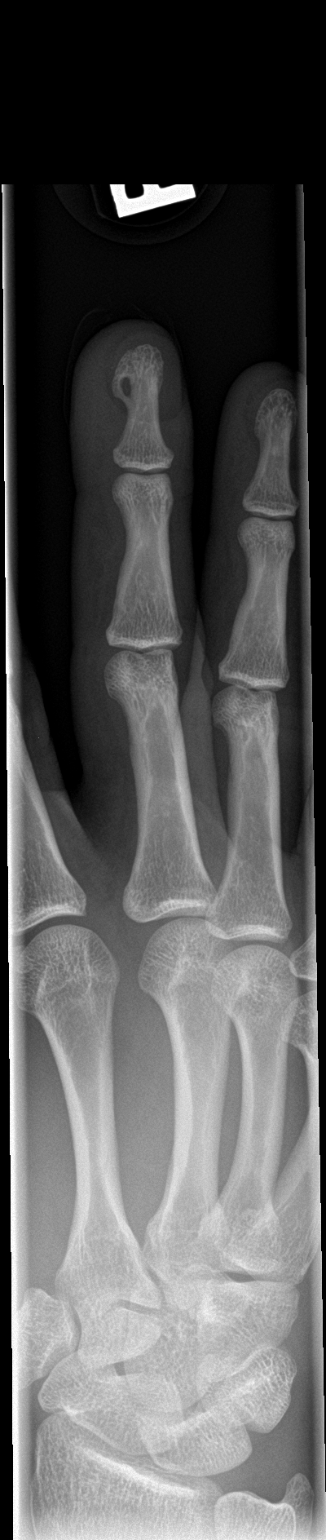

[finger lat]
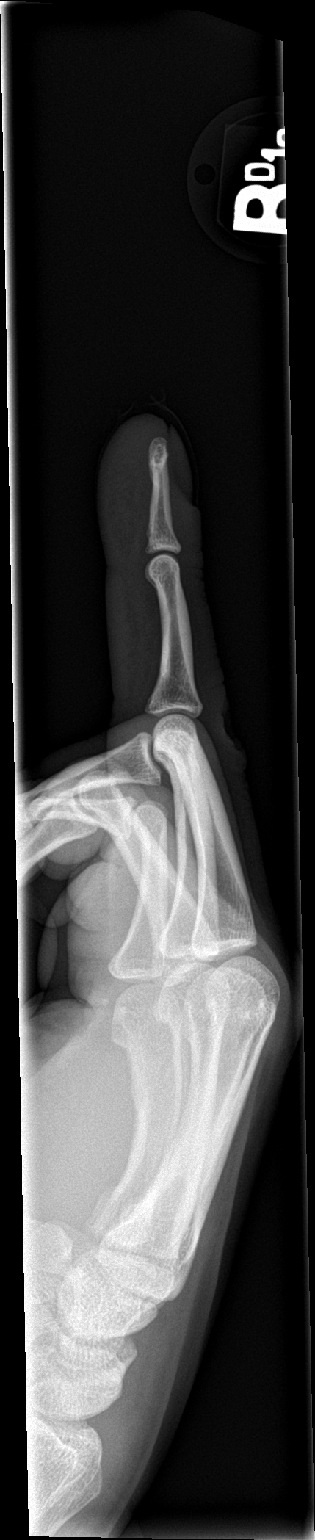

[3 of 3 positions shown; findings below may reference images not displayed]

FINDINGS: There is no evidence of fracture or dislocation. Normal alignment
and joint spaces. Stable lucency in the radial aspect of the distal
phalanx from prior exam, may represent sequela of prior injury,
epidermal inclusion cyst, or congenital abnormality. Regardless,
this is benign. Soft tissues are unremarkable.
IMPRESSION: No acute fracture or subluxation of the right third digit.
